# Patient Record
Sex: Female | Born: 1980 | Race: Black or African American | Hispanic: No | Marital: Single | Smoking: Never smoker
Health system: Southern US, Community
[De-identification: ages and names within clinical notes are randomized; demographics above are authoritative.]

## PROBLEM LIST (undated history)

## (undated) DIAGNOSIS — I351 Nonrheumatic aortic (valve) insufficiency: Secondary | ICD-10-CM

## (undated) DIAGNOSIS — I10 Essential (primary) hypertension: Secondary | ICD-10-CM

---

## 2014-08-17 ENCOUNTER — Emergency Department (HOSPITAL_COMMUNITY)
Admission: EM | Admit: 2014-08-17 | Discharge: 2014-08-17 | Disposition: A | Discharge status: Home / Self Care | Payer: Self-pay | Attending: Emergency Medicine | Admitting: Emergency Medicine

## 2014-08-17 ENCOUNTER — Encounter (HOSPITAL_COMMUNITY): Payer: Self-pay | Admitting: Emergency Medicine

## 2014-08-17 DIAGNOSIS — IMO0001 Reserved for inherently not codable concepts without codable children: Secondary | ICD-10-CM | POA: Insufficient documentation

## 2014-08-17 DIAGNOSIS — K047 Periapical abscess without sinus: Secondary | ICD-10-CM | POA: Insufficient documentation

## 2014-08-17 DIAGNOSIS — I10 Essential (primary) hypertension: Secondary | ICD-10-CM | POA: Insufficient documentation

## 2014-08-17 DIAGNOSIS — R509 Fever, unspecified: Secondary | ICD-10-CM | POA: Insufficient documentation

## 2014-08-17 HISTORY — DX: Nonrheumatic aortic (valve) insufficiency: I35.1

## 2014-08-17 HISTORY — DX: Essential (primary) hypertension: I10

## 2014-08-17 LAB — RAPID STREP SCREEN (MED CTR MEBANE ONLY): Streptococcus, Group A Screen (Direct): NEGATIVE

## 2014-08-17 MED ORDER — PENICILLIN V POTASSIUM 500 MG PO TABS: 500.0000 mg | ORAL_TABLET | Freq: Four times a day (QID) | ORAL | Status: AC

## 2014-08-17 MED ORDER — TRAMADOL HCL 50 MG PO TABS: 50.0000 mg | ORAL_TABLET | Freq: Four times a day (QID) | ORAL | Status: DC | PRN

## 2014-08-17 NOTE — ED Provider Notes (Signed)
CSN: [id number]     Arrival date & time 08/17/14  4098 History   First MD Initiated Contact with Patient 08/17/14 [phone]     Chief Complaint  Patient presents with  . Fever     (Consider location/radiation/quality/duration/timing/severity/associated sxs/prior Treatment) HPI Alexandria Pitts is a 33 y.o. female who presents emergency department complaining of a toothache, sore throat, fever. Patient states she had a feeling that a lot of her left lower second molar tooth several days ago. States she did get an appointment with a dentist but not for another 10 days. She states last night she developed sore throat, and fever up to 101 overnight. She states she took Tylenol this morning. She denies any facial swelling. Denies any swelling under her tongue. She denies any difficulty opening her mouth. She has no other complaints.  Past Medical History  Diagnosis Date  . Hypertension   . Aortic regurgitation    History reviewed. No pertinent past surgical history. No family history on file. History  Substance Use Topics  . Smoking status: Never Smoker   . Smokeless tobacco: Not on file  . Alcohol Use: No   OB History   Grav Para Term Preterm Abortions TAB SAB Ect Mult Living                 Review of Systems  Constitutional: Positive for fever and chills.  HENT: Positive for dental problem and sore throat. Negative for trouble swallowing.   Respiratory: Negative for cough, chest tightness and shortness of breath.   Cardiovascular: Negative for chest pain, palpitations and leg swelling.  Gastrointestinal: Negative for nausea, vomiting, abdominal pain and diarrhea.  Genitourinary: Negative for dysuria and flank pain.  Musculoskeletal: Positive for myalgias. Negative for neck pain and neck stiffness.  Skin: Negative for rash.  Neurological: Negative for dizziness, weakness and headaches.  All other systems reviewed and are negative.     Allergies  Review of patient's allergies  indicates no known allergies.  Home Medications   Prior to Admission medications   Not on File   BP 119/81  Pulse 65  Temp(Src) 98.1 F (36.7 C) (Oral)  Resp 20  Ht  (1.6 m)  Wt 189 lb (85.73 kg)  BMI 33.49 kg/m2  SpO2 100%  LMP 08/10/2014 Physical Exam  Nursing note and vitals reviewed. Constitutional: She appears well-developed and well-nourished. No distress.  HENT:  Right Ear: Tympanic membrane, external ear and ear canal normal.  Left Ear: Tympanic membrane, external ear and ear canal normal.  Nose: Nose normal.  Mouth/Throat: Uvula is midline and mucous membranes are normal. Posterior oropharyngeal erythema present. No oropharyngeal exudate or tonsillar abscesses.  Left lower 2nd molar with large cavity, tender to palpation. Mild surrounding gum swelling. No facial swelling. No trismus. No swelling under the tongue  Eyes: Conjunctivae are normal.  Neck: Neck supple.  Cardiovascular: Normal rate, regular rhythm and normal heart sounds.   Pulmonary/Chest: Effort normal and breath sounds normal. No respiratory distress. She has no wheezes. She has no rales.  Neurological: She is alert.  Skin: Skin is warm and dry.    ED Course  Procedures (including critical care time) Labs Review Labs Reviewed  RAPID STREP SCREEN  CULTURE, GROUP A STREP    Imaging Review No results found.   EKG Interpretation None      MDM   Final diagnoses:  Dental abscess    Pt with sore throat and dental pain. Oropharynx is erythematous, however, no enlarged tonsils,  uvula midline, no exudate. Strep screen neg. Cultures sent. I suspect pt's fever and pain is most likely caused by a dental infection. Will start on penicillin. She is afebrile here. Non toxic appearing. She has an apt with a dentist in 10 days. Instructed to follow up closely.   Filed Vitals:   08/17/14 0800 08/17/14 0830 08/17/14 0900 08/17/14 0930  BP: 140/85 119/81 109/90 128/82  Pulse: 93 65 64 55  Temp:       TempSrc:      Resp:      Height:      Weight:      SpO2: 100% 100% 100% 100%     Kiffany Schelling A Lilliane Sposito, PA-C 08/17/14 1252

## 2014-08-17 NOTE — ED Notes (Signed)
Pt reports swelling to left side of face at 0300 and sweating. Pt took temp 101.7. Pt took tylenol at that time. Pt reports that her boyfriend was diagnosed with strep throat 3 weeks ago.

## 2014-08-17 NOTE — Discharge Instructions (Signed)
Penicillin for infection as prescribed until all gone. Ibuprofen for pain. Ultram for severe pain. Follow up with dentist    Abscessed Tooth An abscessed tooth is an infection around your tooth. It may be caused by holes or damage to the tooth (cavity) or a dental disease. An abscessed tooth causes mild to very bad pain in and around the tooth. See your dentist right away if you have tooth or gum pain. HOME CARE  Take your medicine as told. Finish it even if you start to feel better.  Do not drive after taking pain medicine.  Rinse your mouth (gargle) often with salt water ( teaspoon salt in 8 ounces of warm water).  Do not apply heat to the outside of your face. GET HELP RIGHT AWAY IF:   You have a temperature by mouth above 102 F (38.9 C), not controlled by medicine.  You have chills and a very bad headache.  You have problems breathing or swallowing.  Your mouth will not open.  You develop puffiness (swelling) on the neck or around the eye.  Your pain is not helped by medicine.  Your pain is getting worse instead of better. MAKE SURE YOU:   Understand these instructions.  Will watch your condition.  Will get help right away if you are not doing well or get worse. Document Released: 05/18/2008 Document Revised: 02/22/2012 Document Reviewed: 03/10/2011 [facility] Patient Information 2015 [place], [place]. This information is not intended to replace advice given to you by your health care provider. Make sure you discuss any questions you have with your health care provider.

## 2014-08-18 NOTE — ED Provider Notes (Signed)
Medical screening examination/treatment/procedure(s) were performed by non-physician practitioner and as supervising physician I was immediately available for consultation/collaboration.   EKG Interpretation None       Derwood Kaplan, MD 08/18/14 [phone]

## 2014-08-19 LAB — CULTURE, GROUP A STREP

## 2014-08-31 ENCOUNTER — Emergency Department (HOSPITAL_COMMUNITY): Payer: Self-pay

## 2014-08-31 ENCOUNTER — Emergency Department (HOSPITAL_COMMUNITY)
Admission: EM | Admit: 2014-08-31 | Discharge: 2014-08-31 | Disposition: A | Discharge status: Home / Self Care | Payer: Self-pay | Attending: Emergency Medicine | Admitting: Emergency Medicine

## 2014-08-31 ENCOUNTER — Encounter (HOSPITAL_COMMUNITY): Payer: Self-pay | Admitting: Emergency Medicine

## 2014-08-31 DIAGNOSIS — IMO0002 Reserved for concepts with insufficient information to code with codable children: Secondary | ICD-10-CM | POA: Insufficient documentation

## 2014-08-31 DIAGNOSIS — M546 Pain in thoracic spine: Secondary | ICD-10-CM

## 2014-08-31 DIAGNOSIS — S4980XA Other specified injuries of shoulder and upper arm, unspecified arm, initial encounter: Secondary | ICD-10-CM | POA: Insufficient documentation

## 2014-08-31 DIAGNOSIS — Y9289 Other specified places as the place of occurrence of the external cause: Secondary | ICD-10-CM | POA: Insufficient documentation

## 2014-08-31 DIAGNOSIS — I10 Essential (primary) hypertension: Secondary | ICD-10-CM | POA: Insufficient documentation

## 2014-08-31 DIAGNOSIS — S46909A Unspecified injury of unspecified muscle, fascia and tendon at shoulder and upper arm level, unspecified arm, initial encounter: Secondary | ICD-10-CM | POA: Insufficient documentation

## 2014-08-31 DIAGNOSIS — Y9389 Activity, other specified: Secondary | ICD-10-CM | POA: Insufficient documentation

## 2014-08-31 DIAGNOSIS — M25512 Pain in left shoulder: Secondary | ICD-10-CM

## 2014-08-31 MED ORDER — DIAZEPAM 5 MG PO TABS: 5.0000 mg | ORAL_TABLET | Freq: Two times a day (BID) | ORAL | Status: DC

## 2014-08-31 MED ORDER — NAPROXEN 500 MG PO TABS: 500.0000 mg | ORAL_TABLET | Freq: Two times a day (BID) | ORAL | Status: DC

## 2014-08-31 MED ORDER — HYDROCODONE-ACETAMINOPHEN 5-325 MG PO TABS: 1.0000 | ORAL_TABLET | Freq: Four times a day (QID) | ORAL | Status: DC | PRN

## 2014-08-31 MED ORDER — OXYCODONE-ACETAMINOPHEN 5-325 MG PO TABS
1.0000 | ORAL_TABLET | Freq: Once | ORAL | Status: DC
  Filled 2014-08-31: qty 1

## 2014-08-31 NOTE — ED Provider Notes (Signed)
CSN: [id number]     Arrival date & time 08/31/14  1001 History   This chart was scribed for non-physician practitioner Terri Piedra, PA-C working with Hilario Quarry, MD by Littie Deeds, ED Scribe. This patient was seen in room TR05C/TR05C and the patient's care was started at 10:49 AM.      Chief Complaint  Patient presents with  . Back Pain      The history is provided by the patient. No language interpreter was used.   HPI Comments: Alexandria Pitts is a 33 y.o. female who presents to the Emergency Department complaining of left-sided constant back pain that started this morning about an hour ago while she was in a van when the hatch came that hit her left shoulder. Pain is worse with breathing and is described as a dull ache when staying still, but much worse when moving. She notes associated left shoulder pain and numbness in her fingers bilaterally. She has tried taking Ultram but without any relief. Patient denies head injury, LOC, fever, chills, nausea, vomiting, urinary symptoms, IV drug use, frequent fractures, Hx of cancer and DM, and is otherwise healthy. She does not take any medications regularly and has NKDA.   Past Medical History  Diagnosis Date  . Hypertension   . Aortic regurgitation    History reviewed. No pertinent past surgical history. No family history on file. History  Substance Use Topics  . Smoking status: Never Smoker   . Smokeless tobacco: Not on file  . Alcohol Use: No   OB History   Grav Para Term Preterm Abortions TAB SAB Ect Mult Living                 Review of Systems See HPI. A complete 10 system review of systems was obtained and all systems are negative except as noted in the HPI and PMH.     Allergies  Review of patient's allergies indicates no known allergies.  Home Medications   Prior to Admission medications   Medication Sig Start Date End Date Taking? Authorizing Provider  diazepam (VALIUM) 5 MG tablet Take 1 tablet (5 mg  total) by mouth 2 (two) times daily. 08/31/14   TRUE Shackleford A Forcucci, PA-C  HYDROcodone-acetaminophen (NORCO/VICODIN) 5-325 MG per tablet Take 1 tablet by mouth every 6 (six) hours as needed for moderate pain or severe pain. 08/31/14   Lacrystal Barbe A Forcucci, PA-C  naproxen (NAPROSYN) 500 MG tablet Take 1 tablet (500 mg total) by mouth 2 (two) times daily. 08/31/14   Kyleeann Cremeans A Forcucci, PA-C   BP 125/80  Pulse 86  Temp(Src) 98.1 F (36.7 C) (Oral)  Resp 17  Ht  (1.6 m)  Wt 190 lb (86.183 kg)  BMI 33.67 kg/m2  SpO2 100%  LMP 08/10/2014 Physical Exam  Nursing note and vitals reviewed. Constitutional: She is oriented to person, place, and time. She appears well-developed and well-nourished. No distress.  HENT:  Head: Normocephalic and atraumatic.  Mouth/Throat: Oropharynx is clear and moist. No oropharyngeal exudate.  Eyes: Conjunctivae and EOM are normal. Pupils are equal, round, and reactive to light. No scleral icterus.  Neck: Normal range of motion. Neck supple. No JVD present. No thyromegaly present.  Cardiovascular: Normal rate, regular rhythm, normal heart sounds and intact distal pulses.  Exam reveals no gallop and no friction rub.   No murmur heard. Pulses:      Radial pulses are 2+ on the right side, and 2+ on the left side.  Pulmonary/Chest: Effort normal  and breath sounds normal. No respiratory distress. She has no wheezes. She has no rales. She exhibits no tenderness.  Musculoskeletal: She exhibits no edema.       Left shoulder: She exhibits decreased range of motion, tenderness, pain and spasm. She exhibits no bony tenderness, no swelling, no effusion, no crepitus, no deformity, no laceration, normal pulse and normal strength.       Thoracic back: She exhibits decreased range of motion, tenderness, bony tenderness, pain and spasm. She exhibits no swelling, no edema, no deformity, no laceration and normal pulse.  No bruising of the back  Lymphadenopathy:    She has no  cervical adenopathy.  Neurological: She is alert and oriented to person, place, and time. She has normal strength. No cranial nerve deficit or sensory deficit. Coordination normal.  No sensory deficits, motor strength normal in all upper and lower extremities  Skin: Skin is warm and dry. No rash noted.  Psychiatric: She has a normal mood and affect. Her behavior is normal. Judgment and thought content normal.    ED Course  Procedures DIAGNOSTIC STUDIES: Oxygen Saturation is 100% on RA, nml by my interpretation.    COORDINATION OF CARE: 11:00 AM-Discussed treatment plan which includes XR of thoracic spine, anti-inflammatory medication, and muscle relaxer (Valium) with pt at bedside and pt agreed to plan.   Labs Review Labs Reviewed - No data to display  Imaging Review Dg Thoracic Spine 2 View  08/31/2014   CLINICAL DATA:  Back pain today  EXAM: THORACIC SPINE - 2 VIEW  COMPARISON:  None.  FINDINGS: There is no evidence of thoracic spine fracture. Alignment is normal. No other significant bone abnormalities are identified.  IMPRESSION: Negative.   Electronically Signed   By: Elige Ko   On: 08/31/2014 11:45   Dg Scapula Left  08/31/2014   CLINICAL DATA:  Pain post trauma  EXAM: LEFT SCAPULA - 2+ VIEWS  COMPARISON:  None.  FINDINGS: Frontal and lateral views were obtained. No demonstrable fracture or dislocation. Joint spaces appear intact. No erosive change.  IMPRESSION: No fracture or appreciable arthropathy.   Electronically Signed   By: Bretta Bang M.D.   On: 08/31/2014 11:42     EKG Interpretation None      MDM   Final diagnoses:  Left-sided thoracic back pain  Left shoulder pain   Patient is a 33 y.o. Female who presents to the ED with left shoulder pain and back pain after trauma to the back.  History negative for red flags.  Physical exam is without focal neurological deficits.  Given some bony tenderness to palpation plain film xrays were negative of the left  scapula and thoracic spine.  Suspect muscle spasm vs muscle strain.  Will treat with naproxen, valium, and hydrocodone.  Will refer to ortho.  Patient to return for cauda equina symptoms.  Patient states understanding and agreement at this time.  Patient stable for discharge.     I personally performed the services described in this documentation, which was scribed in my presence. The recorded information has been reviewed and is accurate.    Eben Burow, PA-C 08/31/14 1216

## 2014-08-31 NOTE — ED Notes (Signed)
Declined W/C at D/C and was escorted to lobby by RN. 

## 2014-08-31 NOTE — ED Notes (Addendum)
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 Pt reports pain with breathing. Pt took a medication that was prescribed for her last time she was here.

## 2014-08-31 NOTE — Discharge Instructions (Signed)
Back Pain, Adult Low back pain is very common. About 1 in 5 people have back pain.The cause of low back pain is rarely dangerous. The pain often gets better over time.About half of people with a sudden onset of back pain feel better in just 2 weeks. About 8 in 10 people feel better by 6 weeks.  CAUSES Some common causes of back pain include:  Strain of the muscles or ligaments supporting the spine.  Wear and tear (degeneration) of the spinal discs.  Arthritis.  Direct injury to the back. DIAGNOSIS Most of the time, the direct cause of low back pain is not known.However, back pain can be treated effectively even when the exact cause of the pain is unknown.Answering your caregiver's questions about your overall health and symptoms is one of the most accurate ways to make sure the cause of your pain is not dangerous. If your caregiver needs more information, he or she may order lab work or imaging tests (X-rays or MRIs).However, even if imaging tests show changes in your back, this usually does not require surgery. HOME CARE INSTRUCTIONS For many people, back pain returns.Since low back pain is rarely dangerous, it is often a condition that people can learn to manageon their own.   Remain active. It is stressful on the back to sit or stand in one place. Do not sit, drive, or stand in one place for more than 30 minutes at a time. Take short walks on level surfaces as soon as pain allows.Try to increase the length of time you walk each day.  Do not stay in bed.Resting more than 1 or 2 days can delay your recovery.  Do not avoid exercise or work.Your body is made to move.It is not dangerous to be active, even though your back may hurt.Your back will likely heal faster if you return to being active before your pain is gone.  Pay attention to your body when you bend and lift. Many people have less discomfortwhen lifting if they bend their knees, keep the load close to their bodies,and  avoid twisting. Often, the most comfortable positions are those that put less stress on your recovering back.  Find a comfortable position to sleep. Use a firm mattress and lie on your side with your knees slightly bent. If you lie on your back, put a pillow under your knees.  Only take over-the-counter or prescription medicines as directed by your caregiver. Over-the-counter medicines to reduce pain and inflammation are often the most helpful.Your caregiver may prescribe muscle relaxant drugs.These medicines help dull your pain so you can more quickly return to your normal activities and healthy exercise.  Put ice on the injured area.  Put ice in a plastic bag.  Place a towel between your skin and the bag.  Leave the ice on for 15-20 minutes, 03-04 times a day for the first 2 to 3 days. After that, ice and heat may be alternated to reduce pain and spasms.  Ask your caregiver about trying back exercises and gentle massage. This may be of some benefit.  Avoid feeling anxious or stressed.Stress increases muscle tension and can worsen back pain.It is important to recognize when you are anxious or stressed and learn ways to manage it.Exercise is a great option. SEEK MEDICAL CARE IF:  You have pain that is not relieved with rest or medicine.  You have pain that does not improve in 1 week.  You have new symptoms.  You are generally not feeling well. SEEK   IMMEDIATE MEDICAL CARE IF:   You have pain that radiates from your back into your legs.  You develop new bowel or bladder control problems.  You have unusual weakness or numbness in your arms or legs.  You develop nausea or vomiting.  You develop abdominal pain.  You feel faint. Document Released: 11/30/2005 Document Revised: 05/31/2012 Document Reviewed: 04/03/2014 [employer] Patient Information 2015 [employer]. This information is not intended to replace advice given to you by your health care provider. Make sure you  discuss any questions you have with your health care provider.  

## 2014-09-01 NOTE — ED Provider Notes (Signed)
History/physical exam/procedure(s) were performed by non-physician practitioner and as supervising physician I was immediately available for consultation/collaboration. I have reviewed all notes and am in agreement with care and plan.   Bela Bonaparte S Kreed Kauffman, MD 09/01/14 1008 

## 2014-12-11 ENCOUNTER — Encounter (HOSPITAL_COMMUNITY): Payer: Self-pay | Admitting: Emergency Medicine

## 2014-12-11 ENCOUNTER — Emergency Department (HOSPITAL_COMMUNITY)
Admission: EM | Admit: 2014-12-11 | Discharge: 2014-12-11 | Disposition: A | Discharge status: Home / Self Care | Payer: Self-pay | Source: Home / Self Care | Attending: Family Medicine | Admitting: Family Medicine

## 2014-12-11 DIAGNOSIS — J029 Acute pharyngitis, unspecified: Secondary | ICD-10-CM

## 2014-12-11 LAB — POCT RAPID STREP A: STREPTOCOCCUS, GROUP A SCREEN (DIRECT): NEGATIVE

## 2014-12-11 NOTE — ED Notes (Signed)
Patient c/o sore throat, fever and eye swelling x 1 day. Patient reports last night her fever was 101 F. Patient took Tylenol and fever subsided. Patient states she feels pain in her neck when she turns her head. She is alert and oriented and in NAD.

## 2014-12-11 NOTE — ED Provider Notes (Signed)
CSN: [id number]     Arrival date & time 12/11/14  [id number] History   First MD Initiated Contact with Patient 12/11/14 [phone]     Chief Complaint  Patient presents with  . Sore Throat   (Consider location/radiation/quality/duration/timing/severity/associated sxs/prior Treatment) HPI Comments: Had been feeling well until yesterday when she developed a sore throat followed by fever of 101 deg. Denies URI sx's. Fever responds to APAP.    Past Medical History  Diagnosis Date  . Hypertension   . Aortic regurgitation    History reviewed. No pertinent past surgical history. No family history on file. History  Substance Use Topics  . Smoking status: Never Smoker   . Smokeless tobacco: Not on file  . Alcohol Use: No   OB History    No data available     Review of Systems  Constitutional: Positive for fever and activity change.  HENT: Positive for sore throat. Negative for congestion, ear pain, rhinorrhea, sinus pressure and trouble swallowing.   Eyes: Positive for discharge.  Respiratory: Negative.   Cardiovascular: Negative for chest pain.  Gastrointestinal: Negative.   Genitourinary: Negative.   Neurological: Negative.     Allergies  Review of patient's allergies indicates no known allergies.  Home Medications   Prior to Admission medications   Medication Sig Start Date End Date Taking? Authorizing Provider  diazepam (VALIUM) 5 MG tablet Take 1 tablet (5 mg total) by mouth 2 (two) times daily. 08/31/14   Courtney A Forcucci, PA-C  HYDROcodone-acetaminophen (NORCO/VICODIN) 5-325 MG per tablet Take 1 tablet by mouth every 6 (six) hours as needed for moderate pain or severe pain. 08/31/14   Courtney A Forcucci, PA-C  naproxen (NAPROSYN) 500 MG tablet Take 1 tablet (500 mg total) by mouth 2 (two) times daily. 08/31/14   Courtney A Forcucci, PA-C   BP 118/83 mmHg  Pulse 93  Temp(Src) 98.9 F (37.2 C) (Oral)  Resp 16  SpO2 98%  LMP 11/28/2014 (Exact Date) Physical Exam   Constitutional: She is oriented to person, place, and time. She appears well-developed and well-nourished. No distress.  HENT:  Mouth/Throat: Oropharyngeal exudate present.  Bilat TM's nl OP with mildly enlarged tonsils with exudates. No trismus  Eyes: Conjunctivae and EOM are normal.  Neck: Normal range of motion. Neck supple.  No nuchal rigidity.  Cardiovascular: Normal rate, regular rhythm and normal heart sounds.   Pulmonary/Chest: Effort normal and breath sounds normal. No respiratory distress. She has no wheezes. She has no rales.  Lymphadenopathy:    She has no cervical adenopathy.  Neurological: She is alert and oriented to person, place, and time.  Skin: Skin is warm and dry.  Psychiatric: She has a normal mood and affect.  Nursing note and vitals reviewed.   ED Course  Procedures (including critical care time) Labs Review Labs Reviewed  POCT RAPID STREP A ([facility] ONLY)   Results for orders placed or performed during the hospital encounter of 12/11/14  POCT rapid strep A [facility])  Result Value Ref Range   Streptococcus, Group A Screen (Direct) NEGATIVE NEGATIVE    Imaging Review No results found.   MDM   1. Exudative pharyngitis    T/C pending Tylenol or ibuprofen prn Cepacol Lozenges Lots of fluids    Hayden Rasmussenavid Von Inscoe, NP 12/11/14 [phone]

## 2014-12-12 LAB — CULTURE, GROUP A STREP

## 2014-12-12 NOTE — ED Notes (Signed)
Throat culture: Group A Strep (S. Pyogenes).  Message to Hayden Rasmussenavid Mabe NP. Vassie MoselleYork, Giamarie Bueche M 12/12/2014

## 2014-12-13 ENCOUNTER — Telehealth (HOSPITAL_COMMUNITY): Payer: Self-pay | Admitting: *Deleted

## 2014-12-13 MED ORDER — AMOXICILLIN 500 MG PO CAPS: 1000.0000 mg | ORAL_CAPSULE | Freq: Two times a day (BID) | ORAL | Status: DC

## 2014-12-13 NOTE — ED Notes (Signed)
Throat culture: Group A Strep.  12/30 Message sent to Hayden Rasmussenavid Mabe NP. He e-prescribed Amoxicillin to pt.'s pharmacy.  I called pt. Pt. verified x 2 and given results.  Pt. told she needs Amoxicillin for strep throat and where to pick up her Rx. I told her to call back if her pharmacy is closed and we will send it to a 24 hr pharmacy. Alexandria Pitts, Alexandria Pitts 12/13/2014

## 2015-02-17 IMAGING — CR DG SCAPULA*L*
2 series · 2 of 2 positions shown · non-contrast
Comparison: None.

CLINICAL DATA: Pain post trauma

EXAM:
LEFT SCAPULA - 2+ VIEWS

[w scapula ap/pa left *]
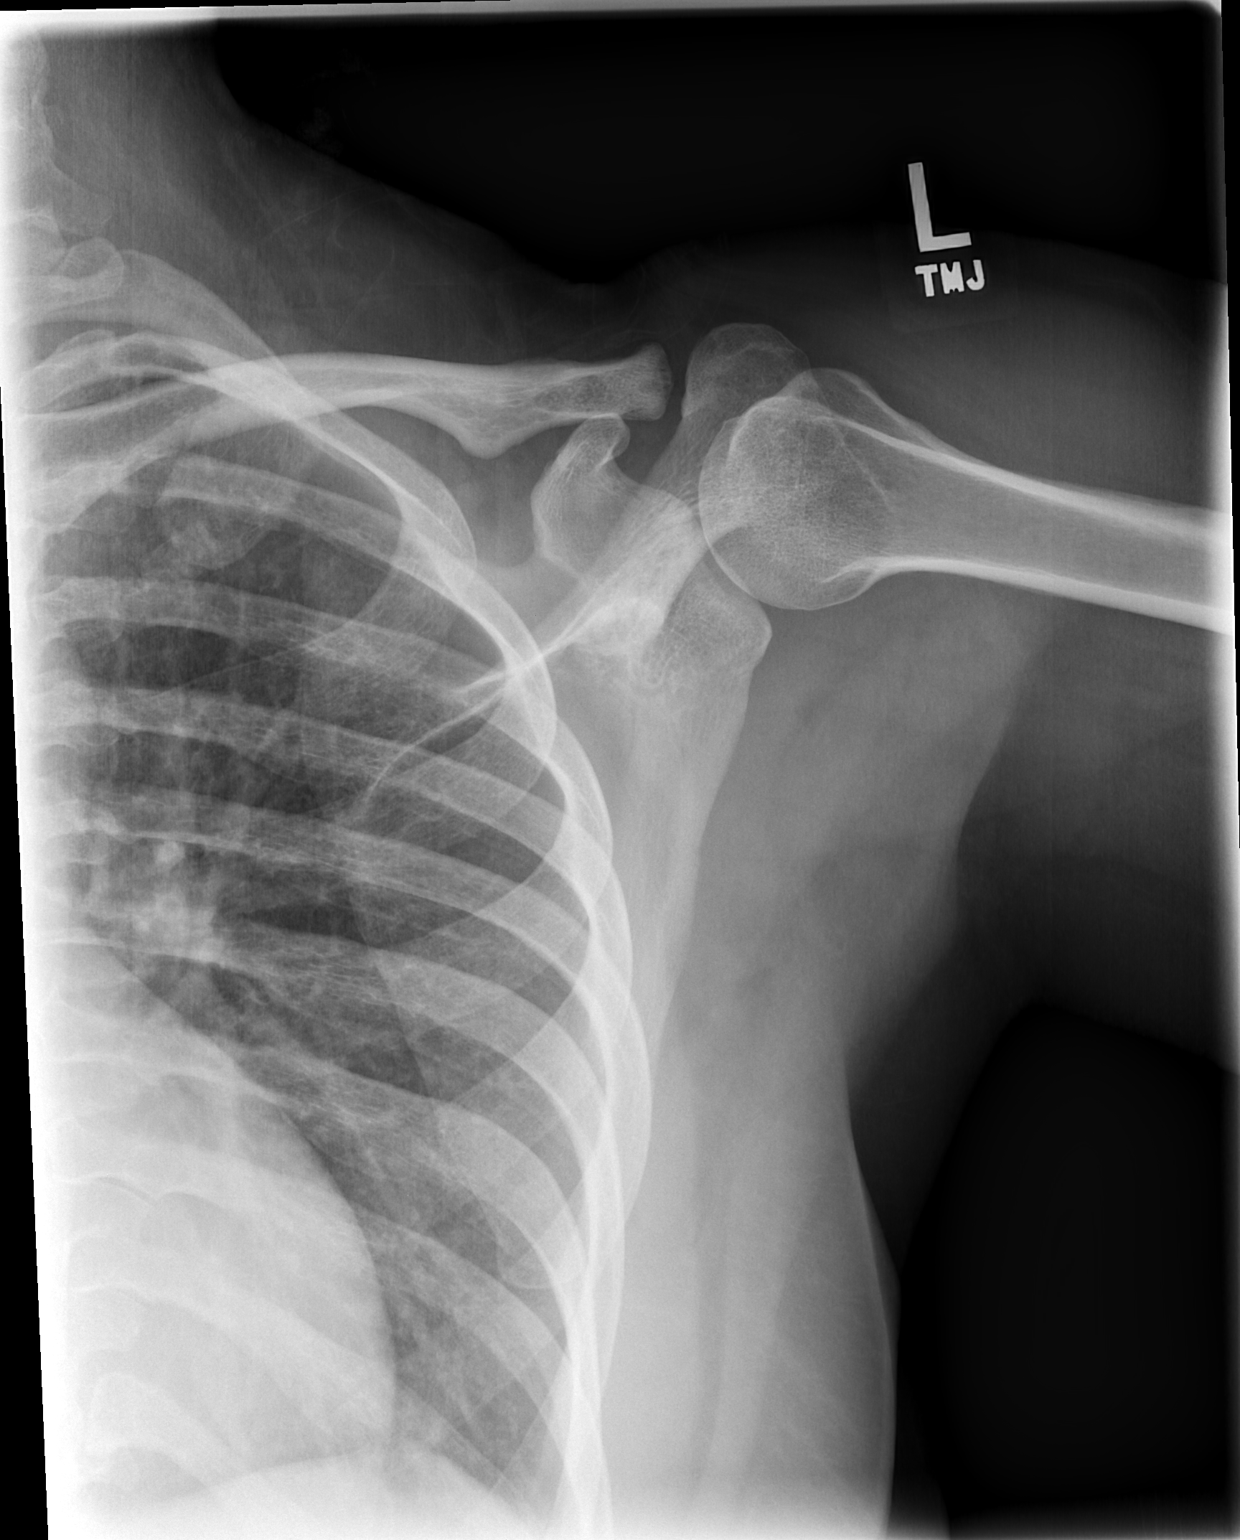

[w scapula lat left *]
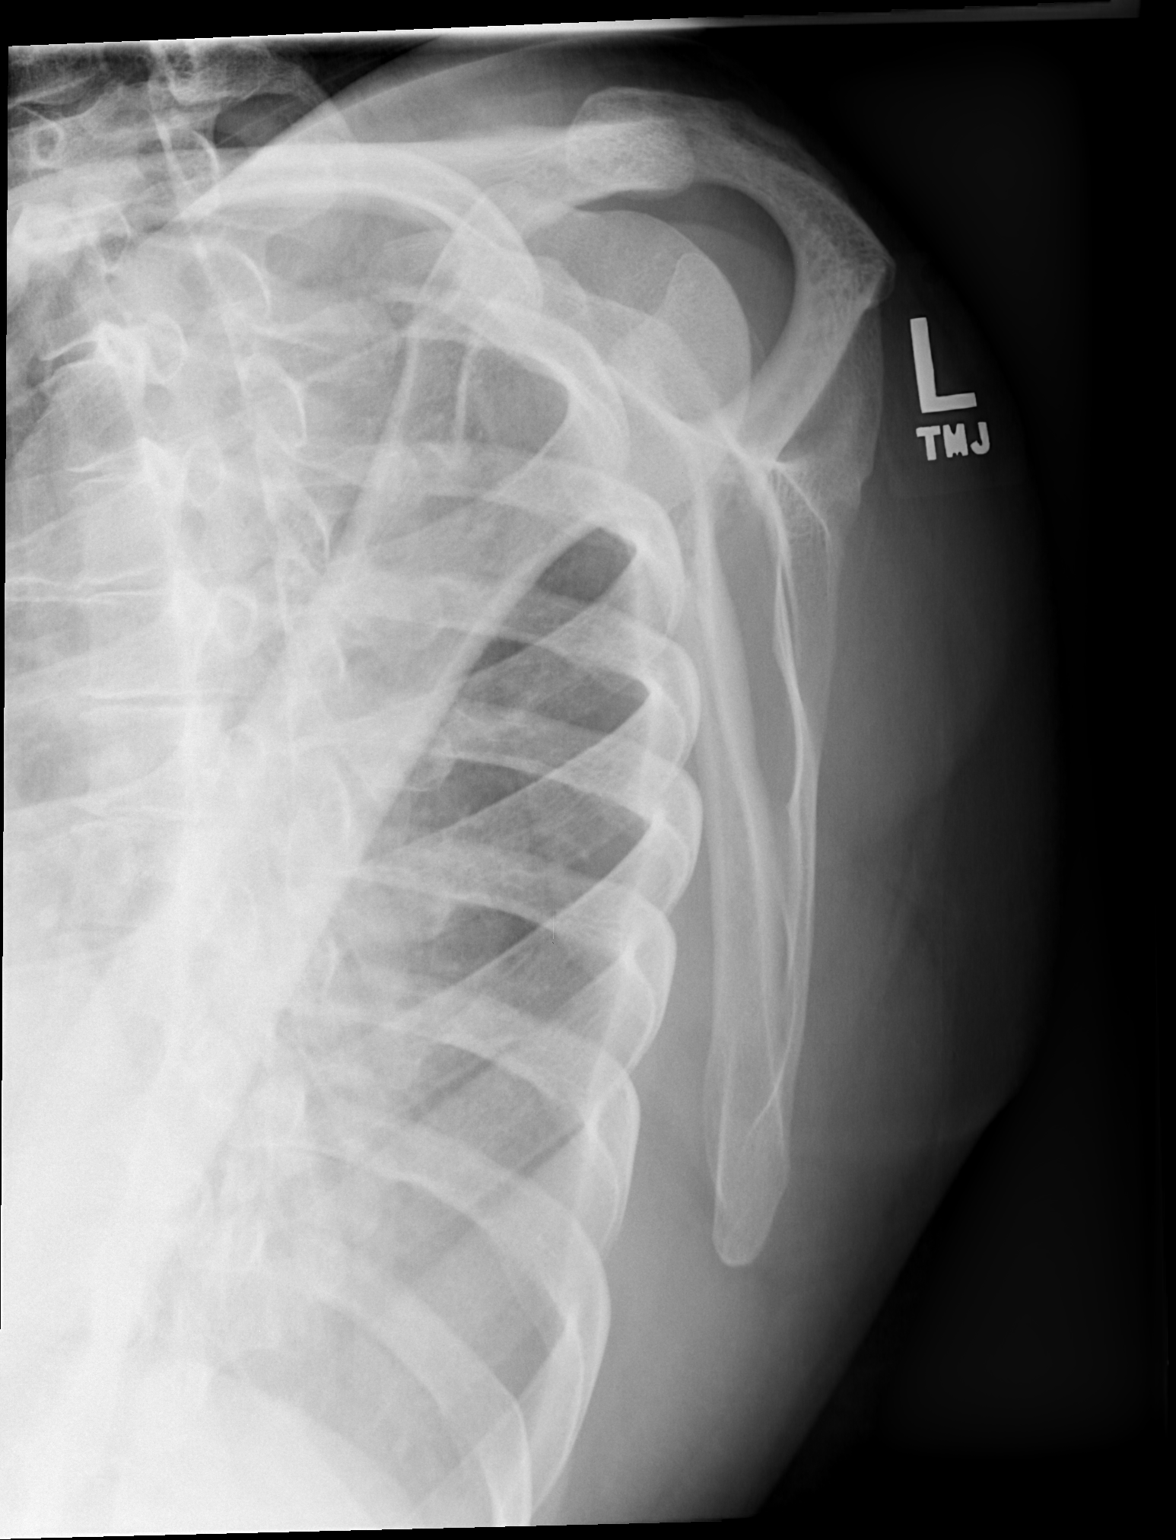

[2 of 2 positions shown; findings below may reference images not displayed]

FINDINGS: Frontal and lateral views were obtained. No demonstrable fracture or
dislocation. Joint spaces appear intact. No erosive change.
IMPRESSION: No fracture or appreciable arthropathy.

## 2015-02-17 IMAGING — CR DG THORACIC SPINE 2V
4 series · 4 of 4 positions shown · non-contrast
Comparison: None.

CLINICAL DATA: Back pain today

EXAM:
THORACIC SPINE - 2 VIEW

[w t-spine a.p. * (1 of 2)]
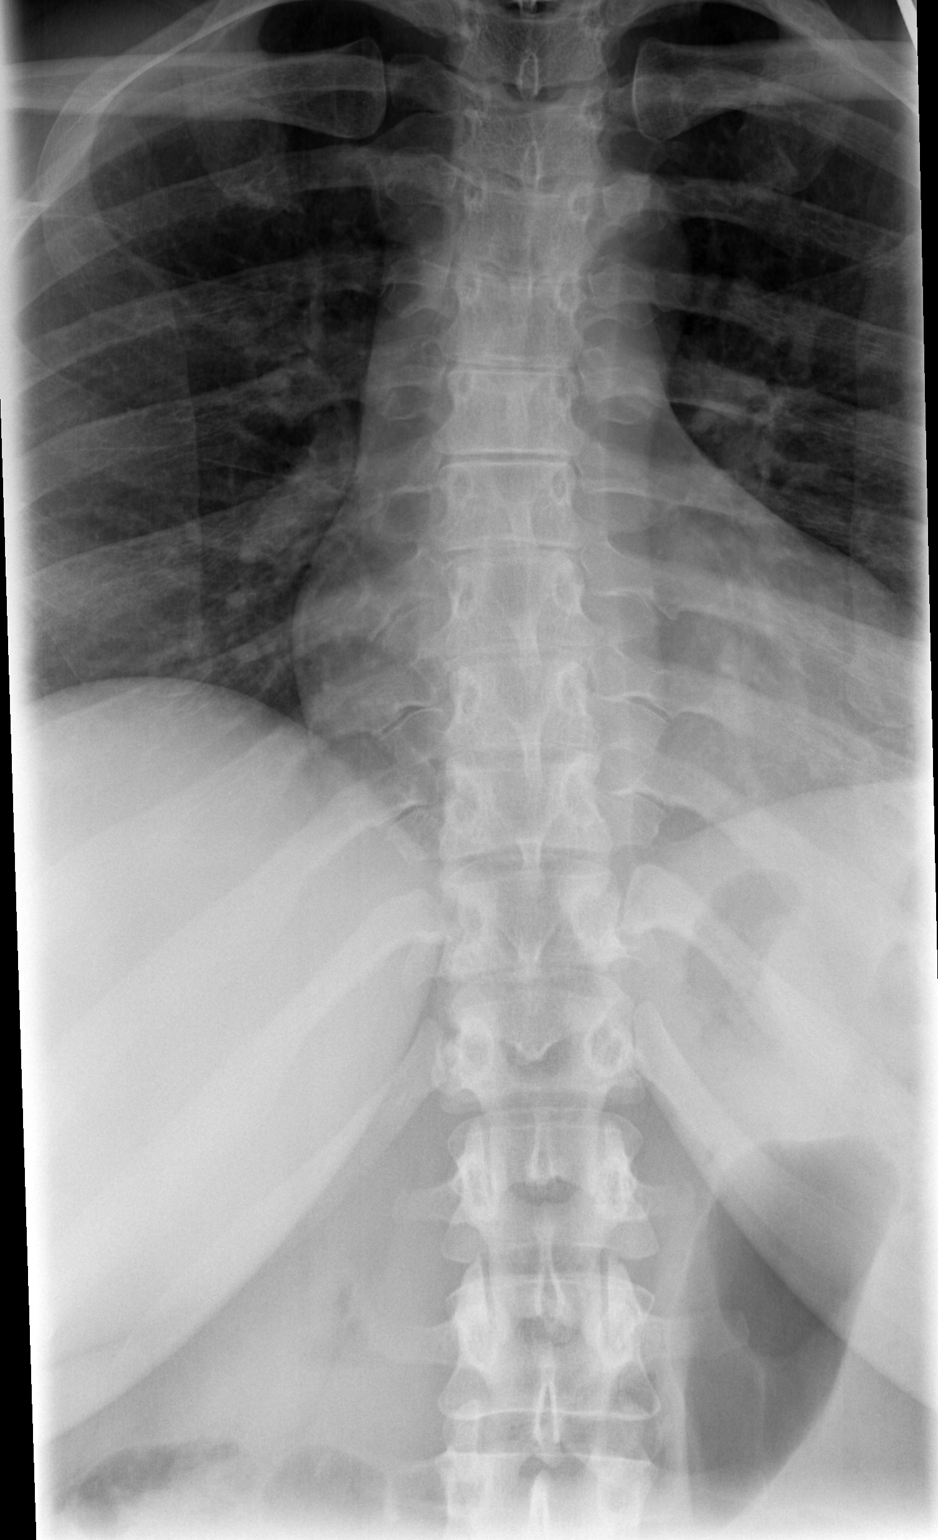

[w t-spine a.p. * (2 of 2)]
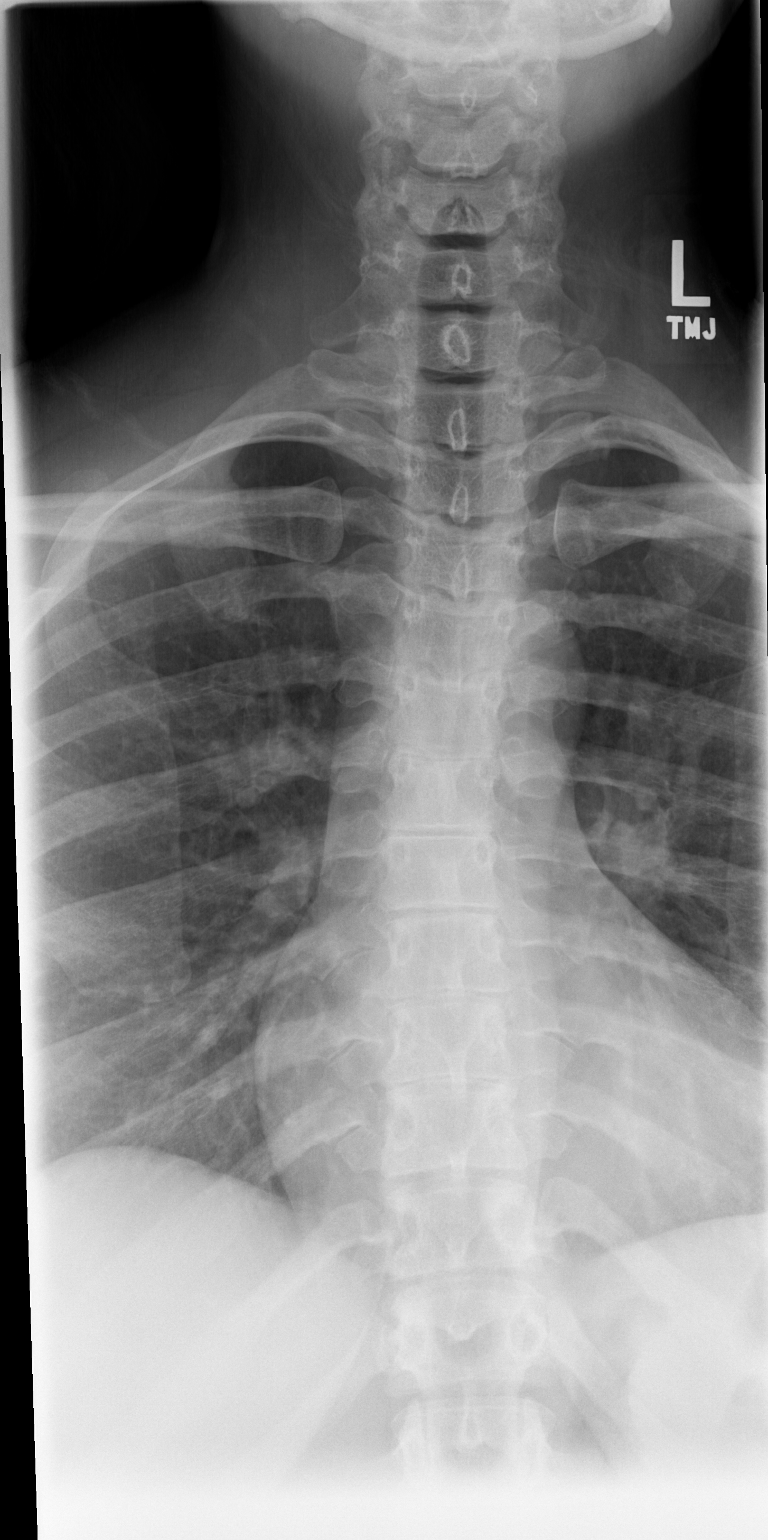

[w t-spine lat *]
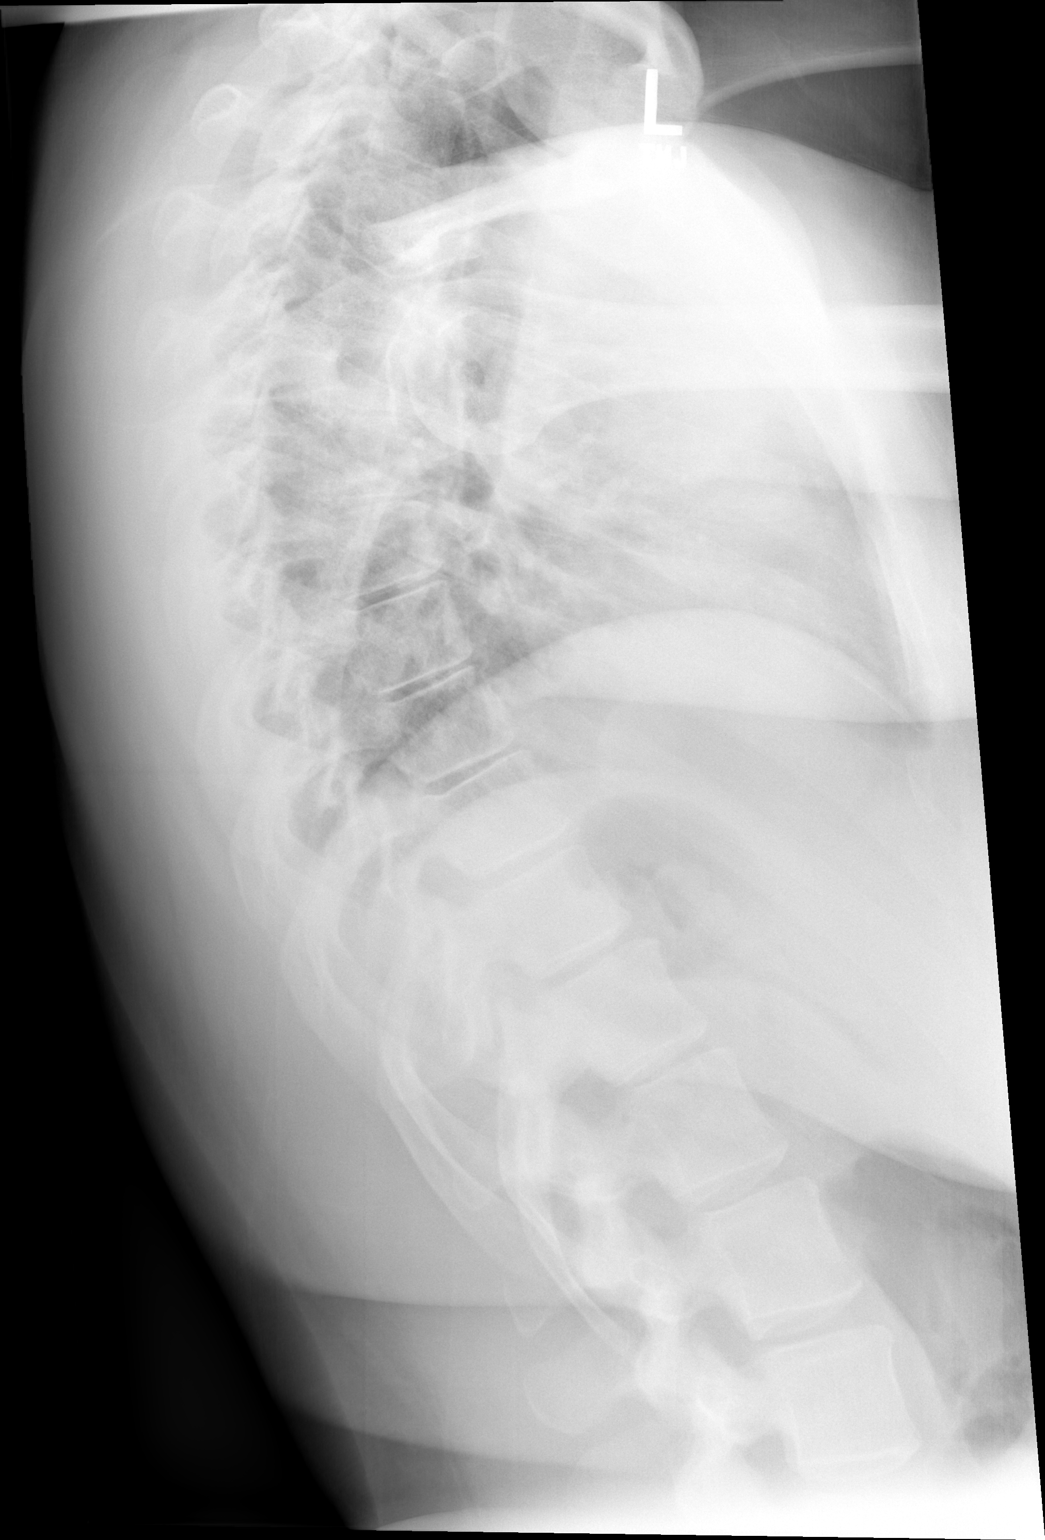

[w swimmers view]
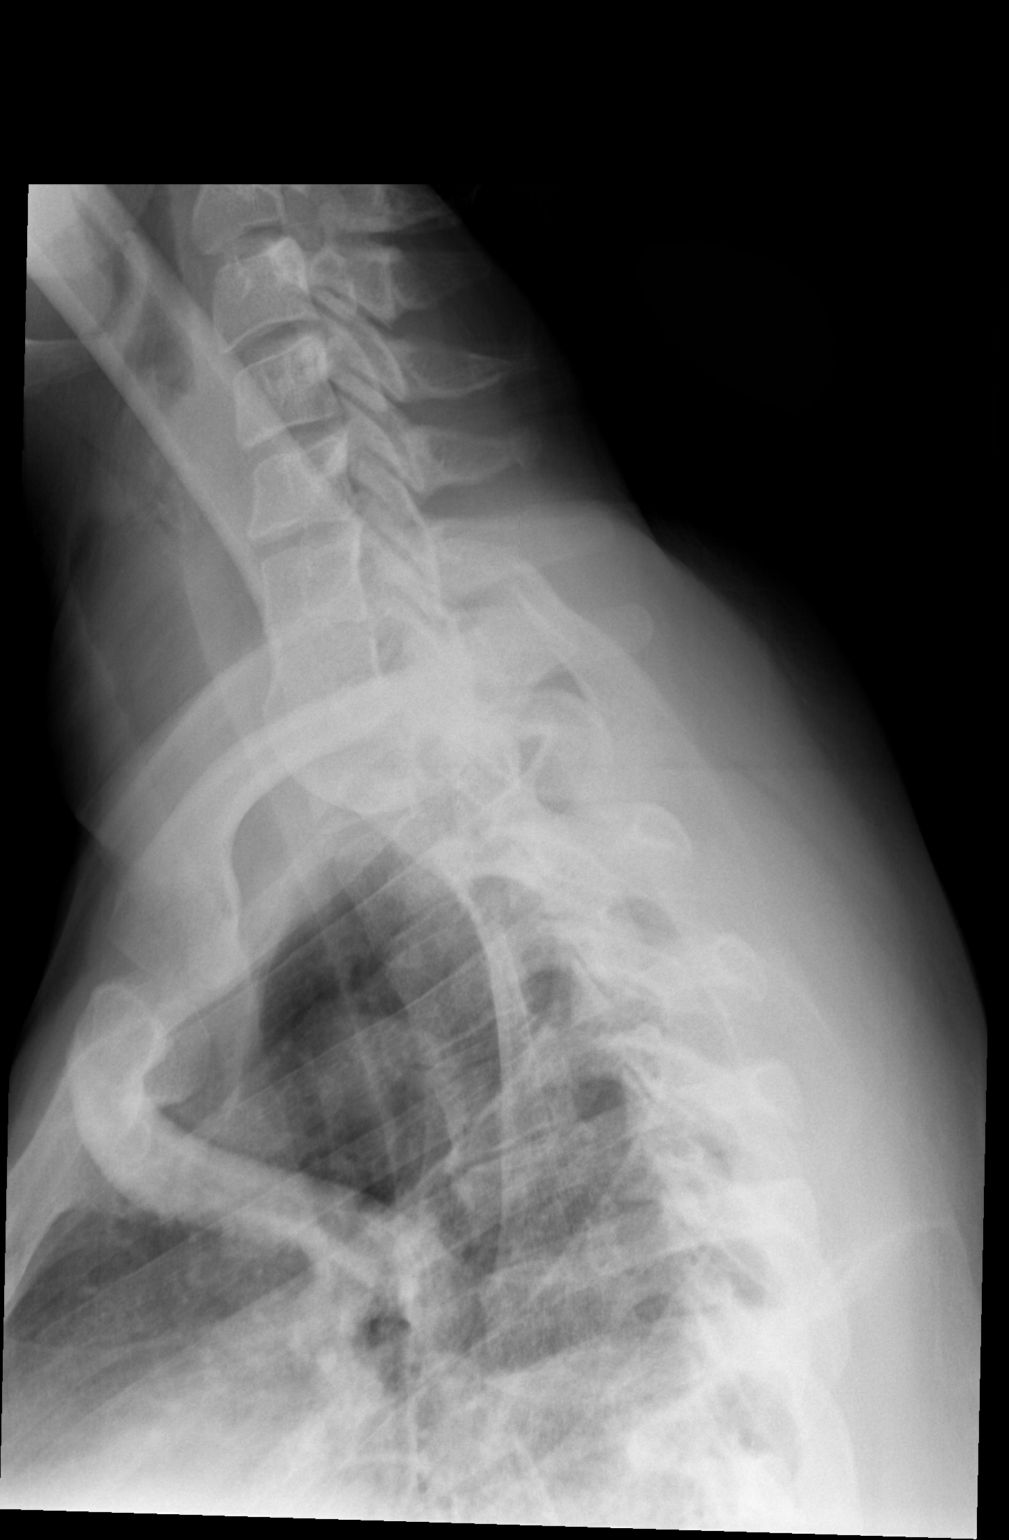

[4 of 4 positions shown; findings below may reference images not displayed]

FINDINGS: There is no evidence of thoracic spine fracture. Alignment is
normal. No other significant bone abnormalities are identified.
IMPRESSION: Negative.

## 2015-06-11 ENCOUNTER — Encounter (HOSPITAL_COMMUNITY): Payer: Self-pay | Admitting: Nurse Practitioner

## 2015-06-11 ENCOUNTER — Emergency Department (HOSPITAL_COMMUNITY)
Admission: EM | Admit: 2015-06-11 | Discharge: 2015-06-11 | Disposition: A | Discharge status: Home / Self Care | Payer: Self-pay | Attending: Emergency Medicine | Admitting: Emergency Medicine

## 2015-06-11 DIAGNOSIS — W208XXA Other cause of strike by thrown, projected or falling object, initial encounter: Secondary | ICD-10-CM | POA: Insufficient documentation

## 2015-06-11 DIAGNOSIS — S40811A Abrasion of right upper arm, initial encounter: Secondary | ICD-10-CM | POA: Insufficient documentation

## 2015-06-11 DIAGNOSIS — Y998 Other external cause status: Secondary | ICD-10-CM | POA: Insufficient documentation

## 2015-06-11 DIAGNOSIS — I10 Essential (primary) hypertension: Secondary | ICD-10-CM | POA: Insufficient documentation

## 2015-06-11 DIAGNOSIS — S0083XA Contusion of other part of head, initial encounter: Secondary | ICD-10-CM | POA: Insufficient documentation

## 2015-06-11 DIAGNOSIS — Y9389 Activity, other specified: Secondary | ICD-10-CM | POA: Insufficient documentation

## 2015-06-11 DIAGNOSIS — Y92410 Unspecified street and highway as the place of occurrence of the external cause: Secondary | ICD-10-CM | POA: Insufficient documentation

## 2015-06-11 DIAGNOSIS — Z8679 Personal history of other diseases of the circulatory system: Secondary | ICD-10-CM | POA: Insufficient documentation

## 2015-06-11 MED ORDER — HYDROCODONE-ACETAMINOPHEN 5-325 MG PO TABS
1.0000 | ORAL_TABLET | Freq: Once | ORAL | Status: AC
  Administered 2015-06-11: 1 via ORAL
  Filled 2015-06-11: qty 1
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
## 2015-06-11 NOTE — Discharge Instructions (Signed)
Thank you for allowing us to be involved in your healthcare while [facility] were [facility].   Please note that there have not been changes to your home medications.  --> PLEASE LOOK AT YOUR DISCHARGE MEDICATION LIST FOR DETAILS.  Please call your PCP if you have any questions or concerns, or any difficulty getting any of your medications.  Please return to the ER if you have worsening of your symptoms or new severe symptoms arise.   IF YOU DEVELOP ANY WORSENING HEADACHE, CONFUSION, OR SYMPTOMS OF CHANGES IN VISION, WEAKNESS OR NUMBNESS, PLEASE RETURN TO THE EMERGENCY DEPARTMENT.  Hematoma A hematoma is a collection of blood under the skin, in an organ, in a body space, in a joint space, or in other tissue. The blood can clot to form a lump that you can see and feel. The lump is often firm and may sometimes become sore and tender. Most hematomas get better in a few days to weeks. However, some hematomas may be serious and require medical care. Hematomas can range in size from very small to very large. CAUSES  A hematoma can be caused by a blunt or penetrating injury. It can also be caused by spontaneous leakage from a blood vessel under the skin. Spontaneous leakage from a blood vessel is more likely to occur in older people, especially those taking blood thinners. Sometimes, a hematoma can develop after certain medical procedures. SIGNS AND SYMPTOMS   A firm lump on the body.  Possible pain and tenderness in the area.  Bruising.Blue, dark blue, purple-red, or yellowish skin may appear at the site of the hematoma if the hematoma is close to the surface of the skin. For hematomas in deeper tissues or body spaces, the signs and symptoms may be subtle. For example, an intra-abdominal hematoma may cause abdominal pain, weakness, fainting, and shortness of breath. An intracranial hematoma may cause a headache or symptoms such as weakness, trouble speaking, or a change  in consciousness. DIAGNOSIS  A hematoma can usually be diagnosed based on your medical history and a physical exam. Imaging tests may be needed if your health care provider suspects a hematoma in deeper tissues or body spaces, such as the abdomen, head, or chest. These tests may include ultrasonography or a CT scan.  TREATMENT  Hematomas usually go away on their own over time. Rarely does the blood need to be drained out of the body. Large hematomas or those that may affect vital organs will sometimes need surgical drainage or monitoring. HOME CARE INSTRUCTIONS   Apply ice to the injured area:   Put ice in a plastic bag.   Place a towel between your skin and the bag.   Leave the ice on for 20 minutes, 2-3 times a day for the first 1 to 2 days.   After the first 2 days, switch to using warm compresses on the hematoma.   Elevate the injured area to help decrease pain and swelling. Wrapping the area with an elastic bandage may also be helpful. Compression helps to reduce swelling and promotes shrinking of the hematoma. Make sure the bandage is not wrapped too tight.   If your hematoma is on a lower extremity and is painful, crutches may be helpful for a couple days.   Only take over-the-counter or prescription medicines as directed by your health care provider. SEEK IMMEDIATE MEDICAL CARE IF:   You have increasing pain, or your pain is not controlled with medicine.  You have a fever.   You have worsening swelling or discoloration.   Your skin over the hematoma breaks or starts bleeding.   Your hematoma is in your chest or abdomen and you have weakness, shortness of breath, or a change in consciousness.  Your hematoma is on your scalp (caused by a fall or injury) and you have a worsening headache or a change in alertness or consciousness. MAKE SURE YOU:   Understand these instructions.  Will watch your condition.  Will get help right away if you are not doing well or  get worse. Document Released: 07/14/2004 Document Revised: 08/02/2013 Document Reviewed: 05/10/2013 [facility] Patient Information 2015 [place], [place]. This information is not intended to replace advice given to you by your health care provider. Make sure you discuss any questions you have with your health care provider.
